# Patient Record
Sex: Male | Born: 1955 | Race: White | Hispanic: No | Marital: Single | Smoking: Current every day smoker
Health system: Southern US, Community
[De-identification: ages and names within clinical notes are randomized; demographics above are authoritative.]

## PROBLEM LIST (undated history)

## (undated) ENCOUNTER — Emergency Department

## (undated) DIAGNOSIS — Z720 Tobacco use: Secondary | ICD-10-CM

## (undated) DIAGNOSIS — M720 Palmar fascial fibromatosis [Dupuytren]: Secondary | ICD-10-CM

## (undated) DIAGNOSIS — K759 Inflammatory liver disease, unspecified: Secondary | ICD-10-CM

## (undated) DIAGNOSIS — J189 Pneumonia, unspecified organism: Secondary | ICD-10-CM

## (undated) HISTORY — PX: FINGER AMPUTATION: SHX636

---

## 2015-10-07 ENCOUNTER — Emergency Department
Admission: EM | Admit: 2015-10-07 | Discharge: 2015-10-07 | Disposition: A | Discharge status: Home / Self Care | Payer: Self-pay | Attending: Emergency Medicine | Admitting: Emergency Medicine

## 2015-10-07 DIAGNOSIS — S8002XA Contusion of left knee, initial encounter: Secondary | ICD-10-CM | POA: Insufficient documentation

## 2015-10-07 DIAGNOSIS — S4992XA Unspecified injury of left shoulder and upper arm, initial encounter: Secondary | ICD-10-CM | POA: Insufficient documentation

## 2015-10-07 DIAGNOSIS — Y998 Other external cause status: Secondary | ICD-10-CM | POA: Insufficient documentation

## 2015-10-07 DIAGNOSIS — S79912A Unspecified injury of left hip, initial encounter: Secondary | ICD-10-CM | POA: Insufficient documentation

## 2015-10-07 DIAGNOSIS — S8001XA Contusion of right knee, initial encounter: Secondary | ICD-10-CM | POA: Insufficient documentation

## 2015-10-07 DIAGNOSIS — Y92410 Unspecified street and highway as the place of occurrence of the external cause: Secondary | ICD-10-CM | POA: Insufficient documentation

## 2015-10-07 DIAGNOSIS — S79911A Unspecified injury of right hip, initial encounter: Secondary | ICD-10-CM | POA: Insufficient documentation

## 2015-10-07 DIAGNOSIS — S50312A Abrasion of left elbow, initial encounter: Secondary | ICD-10-CM | POA: Insufficient documentation

## 2015-10-07 DIAGNOSIS — S4991XA Unspecified injury of right shoulder and upper arm, initial encounter: Secondary | ICD-10-CM | POA: Insufficient documentation

## 2015-10-07 DIAGNOSIS — S59901A Unspecified injury of right elbow, initial encounter: Secondary | ICD-10-CM | POA: Insufficient documentation

## 2015-10-07 DIAGNOSIS — Y9355 Activity, bike riding: Secondary | ICD-10-CM | POA: Insufficient documentation

## 2015-10-07 DIAGNOSIS — T148XXA Other injury of unspecified body region, initial encounter: Secondary | ICD-10-CM

## 2015-10-07 DIAGNOSIS — M62830 Muscle spasm of back: Secondary | ICD-10-CM | POA: Insufficient documentation

## 2015-10-07 MED ORDER — MELOXICAM 15 MG PO TABS: 15.0000 mg | ORAL_TABLET | Freq: Every day | ORAL | Status: DC

## 2015-10-07 MED ORDER — CYCLOBENZAPRINE HCL 10 MG PO TABS: 10.0000 mg | ORAL_TABLET | Freq: Three times a day (TID) | ORAL | Status: DC | PRN
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
## 2016-12-16 ENCOUNTER — Encounter: Payer: Self-pay | Admitting: Emergency Medicine

## 2016-12-16 ENCOUNTER — Emergency Department
Admission: EM | Admit: 2016-12-16 | Discharge: 2016-12-16 | Disposition: A | Discharge status: Home / Self Care | Payer: Managed Care, Other (non HMO) | Attending: Emergency Medicine | Admitting: Emergency Medicine

## 2016-12-16 DIAGNOSIS — M545 Low back pain: Secondary | ICD-10-CM | POA: Diagnosis present

## 2016-12-16 DIAGNOSIS — F17200 Nicotine dependence, unspecified, uncomplicated: Secondary | ICD-10-CM | POA: Diagnosis not present

## 2016-12-16 DIAGNOSIS — M5432 Sciatica, left side: Secondary | ICD-10-CM

## 2016-12-16 DIAGNOSIS — Z79899 Other long term (current) drug therapy: Secondary | ICD-10-CM | POA: Diagnosis not present

## 2016-12-16 DIAGNOSIS — M5442 Lumbago with sciatica, left side: Secondary | ICD-10-CM | POA: Diagnosis not present

## 2016-12-16 DIAGNOSIS — I10 Essential (primary) hypertension: Secondary | ICD-10-CM | POA: Insufficient documentation

## 2016-12-16 MED ORDER — KETOROLAC TROMETHAMINE 30 MG/ML IJ SOLN
30.0000 mg | Freq: Once | INTRAMUSCULAR | Status: AC
  Administered 2016-12-16: 30 mg via INTRAMUSCULAR
  Filled 2016-12-16: qty 1

## 2016-12-16 MED ORDER — OXYCODONE-ACETAMINOPHEN 5-325 MG PO TABS
1.0000 | ORAL_TABLET | Freq: Once | ORAL | Status: AC
  Administered 2016-12-16: 1 via ORAL
  Filled 2016-12-16: qty 1

## 2016-12-16 MED ORDER — OXYCODONE-ACETAMINOPHEN 5-325 MG PO TABS: 1.0000 | ORAL_TABLET | Freq: Four times a day (QID) | ORAL | 0 refills | Status: DC | PRN

## 2016-12-16 MED ORDER — DEXAMETHASONE SODIUM PHOSPHATE 10 MG/ML IJ SOLN
10.0000 mg | Freq: Once | INTRAMUSCULAR | Status: AC
  Administered 2016-12-16: 10 mg via INTRAMUSCULAR
  Filled 2016-12-16: qty 1

## 2016-12-16 NOTE — ED Provider Notes (Signed)
[facility] Emergency Department Provider Note   ____________________________________________    I have reviewed the triage vital signs and the nursing notes.   HISTORY  Chief Complaint Back Pain and Hypertension     HPI Richard Steele is a 60 y.o. male who presents with complaints of back pain with radiation down his left leg. He reports he has a history of sciatica. He began having pain in his left lower back yesterday and the pain travels down his left leg. He denies weakness or heaviness. Denies abdominal pain. No difficulty urinating. No loss of sensation. No IV drug abuse   History reviewed. No pertinent past medical history.  There are no active problems to display for this patient.   History reviewed. No pertinent surgical history.  Prior to Admission medications   Medication Sig Start Date End Date Taking? Authorizing Provider  cyclobenzaprine (FLEXERIL) 10 MG tablet Take 1 tablet (10 mg total) by mouth 3 (three) times daily as needed for muscle spasms. 10/07/15   Delorise RoyalsJonathan D Cuthriell, PA-C  meloxicam (MOBIC) 15 MG tablet Take 1 tablet (15 mg total) by mouth daily. 10/07/15   Delorise RoyalsJonathan D Cuthriell, PA-C     Allergies Codeine  No family history on file.  Social History Social History  Substance Use Topics  . Smoking status: Current Every Day Smoker  . Smokeless tobacco: Never Used  . Alcohol use Yes     Comment: occ    Review of Systems  Constitutional: No fever/chills Eyes: No visual changes.  ENT: No neck pain Cardiovascular: Denies chest pain. Respiratory: Denies shortness of breath. Gastrointestinal: No abdominal pain.  No nausea, no vomiting.   Genitourinary: Negative for Incontinence Musculoskeletal: Positive for right lower back pain Skin: Negative for rash. Neurological: Negative for weakness  10-point ROS otherwise negative.  ____________________________________________   PHYSICAL EXAM:  VITAL SIGNS: ED  Triage Vitals  Enc Vitals Group     BP 12/16/16 0944 (!) 186/96     Pulse Rate 12/16/16 0944 64     Resp 12/16/16 0944 16     Temp 12/16/16 0951 98.2 F (36.8 C)     Temp Source 12/16/16 0951 Oral     SpO2 12/16/16 0940 96 %     Weight 12/16/16 0944 180 lb (81.6 kg)     Height 12/16/16 0944 5\' 6"  (1.676 m)     Head Circumference --      Peak Flow --      Pain Score --      Pain Loc --      Pain Edu? --      Excl. in GC? --     Constitutional: Alert and oriented. . Pleasant and interactive Eyes: Conjunctivae are normal.   Nose: No congestion/rhinnorhea. Mouth/Throat: Mucous membranes are moist.    Cardiovascular: Normal rate, regular rhythm. Grossly normal heart sounds.  Good peripheral circulation. Respiratory: Normal respiratory effort.  No retractions. Lungs CTAB. Gastrointestinal: Soft and nontender. No distention.  No CVA tenderness. Genitourinary: deferred Musculoskeletal: Back: Tenderness to palpation along the left lower lumbar paraspinal area, consistent with muscle spasm. No lower extremity tenderness nor edema.  Warm and well perfused. Normal strength in the lower extremities although pain with elevation of left leg radiates up to his left back Neurologic:  Normal speech and language. No gross focal neurologic deficits are appreciated.  Skin:  Skin is warm, dry and intact. No rash noted. Psychiatric: Mood and affect are normal. Speech and behavior are normal.  ____________________________________________   LABS (all labs ordered are listed, but only abnormal results are displayed)  Labs Reviewed - No data to display ____________________________________________  EKG  None ____________________________________________  RADIOLOGY  None ____________________________________________   PROCEDURES  Procedure(s) performed: No    Critical Care performed: No ____________________________________________   INITIAL IMPRESSION / ASSESSMENT AND PLAN / ED  COURSE  Pertinent labs & imaging results that were available during my care of the patient were reviewed by me and considered in my medical decision making (see chart for details).  Patient present with back pain consistent with sciatica. Does lift heavy boxes for living. He has no lower cavity weakness, urinary incontinence or other concerning symptoms. No vertebral tenderness to palpation. Does have a significant muscle spasm on the left which may be contracting. We will treat with Decadron, Toradol and by mouth Percocet  Clinical Course   Patient's pain improved significant with treatment. He is ambulating in his room without difficulty. We'll discharge with analgesics, we did discuss return precautions. ____________________________________________   FINAL CLINICAL IMPRESSION(S) / ED DIAGNOSES  Final diagnoses:  Sciatica of left side      NEW MEDICATIONS STARTED DURING THIS VISIT:  New Prescriptions   No medications on file     Note:  This document was prepared using Dragon voice recognition software and may include unintentional dictation errors.    Jene Everyobert Makalynn Berwanger, MD 12/16/16 [phone]

## 2016-12-16 NOTE — ED Triage Notes (Signed)
[facility]. Patient states that the pain started 2 days ago but got worse yesterday. Patient states that he has had back trouble in the past but it has been several years. Patient states that the pain radiates down his left leg. Patient reports decreased sensation in the left leg. Patient states that he is able to move his leg but it causes severe pain.   EMS also reported that patient was hypertensive with them with a blood pressure of 186/101. Patient denies history of high blood pressure.

## 2016-12-23 ENCOUNTER — Encounter: Payer: Self-pay | Admitting: Emergency Medicine

## 2016-12-23 ENCOUNTER — Emergency Department
Admission: EM | Admit: 2016-12-23 | Discharge: 2016-12-23 | Disposition: A | Discharge status: Home / Self Care | Payer: Managed Care, Other (non HMO) | Attending: Emergency Medicine | Admitting: Emergency Medicine

## 2016-12-23 ENCOUNTER — Emergency Department: Payer: Managed Care, Other (non HMO)

## 2016-12-23 DIAGNOSIS — F17200 Nicotine dependence, unspecified, uncomplicated: Secondary | ICD-10-CM | POA: Insufficient documentation

## 2016-12-23 DIAGNOSIS — M545 Low back pain: Secondary | ICD-10-CM | POA: Diagnosis present

## 2016-12-23 DIAGNOSIS — M5442 Lumbago with sciatica, left side: Secondary | ICD-10-CM | POA: Diagnosis not present

## 2016-12-23 DIAGNOSIS — M5432 Sciatica, left side: Secondary | ICD-10-CM

## 2016-12-23 MED ORDER — PREDNISONE 10 MG PO TABS: 40.0000 mg | ORAL_TABLET | Freq: Every day | ORAL | 0 refills | Status: AC

## 2016-12-23 MED ORDER — KETOROLAC TROMETHAMINE 60 MG/2ML IM SOLN
30.0000 mg | Freq: Once | INTRAMUSCULAR | Status: AC
  Administered 2016-12-23: 30 mg via INTRAMUSCULAR
  Filled 2016-12-23: qty 2

## 2016-12-23 MED ORDER — MELOXICAM 15 MG PO TABS: 15.0000 mg | ORAL_TABLET | Freq: Every day | ORAL | 0 refills | Status: AC

## 2016-12-23 MED ORDER — OXYCODONE-ACETAMINOPHEN 5-325 MG PO TABS
1.0000 | ORAL_TABLET | Freq: Once | ORAL | Status: AC
  Administered 2016-12-23: 1 via ORAL
  Filled 2016-12-23: qty 1

## 2016-12-23 MED ORDER — OXYCODONE-ACETAMINOPHEN 7.5-325 MG PO TABS: 1.0000 | ORAL_TABLET | ORAL | 0 refills | Status: AC | PRN

## 2016-12-23 NOTE — ED Notes (Signed)
See triage note  Was seen last week for back pain  States pain remains in left lower back and radiates into left leg  Hx of sciatic pain in past which has eased up in about 5-7 days  But this pain has gotten worse

## 2016-12-23 NOTE — ED Triage Notes (Signed)
C/O left back / sciatic nerve pain x 8 days.  Seen through ED last Sunday for same.  States he had been taking Oxycodone for pain, with relief.  Patient is now out of medicaiton.

## 2016-12-23 NOTE — ED Provider Notes (Signed)
[facility] Emergency Department Provider Note  ____________________________________________  Time seen: Approximately 3:43 PM  I have reviewed the triage vital signs and the nursing notes.   HISTORY  Chief Complaint Back Pain    HPI Richard Steele is a 61 y.o. male resents to the emergency department with lower left back pain and shooting pain down the back of left leg. Patient was seen in the emergency room one week ago for same symptoms. Patient states that symptoms have not changed in the last week. Patient states he is very frustrated and angry that he is still in pain. Patient states that it is been difficult to walk secondary to pain. He has been taking oxycodone for the last week, which has helped but patient ran out today. Patient [facility] but was having difficulty making an appointment. Patient had same symptoms 30 years ago which resolved after 5 days and denies back pain since. No trauma. Patient denies night sweats, urinary incontinence, weight loss.   History reviewed. No pertinent past medical history.  There are no active problems to display for this patient.   History reviewed. No pertinent surgical history.  Prior to Admission medications   Medication Sig Start Date End Date Taking? Authorizing Provider  meloxicam (MOBIC) 15 MG tablet Take 1 tablet (15 mg total) by mouth daily. 12/23/16 01/02/17  Enid DerryAshley Damia Bobrowski, PA-C  oxyCODONE-acetaminophen (PERCOCET) 7.5-325 MG tablet Take 1 tablet by mouth every 4 (four) hours as needed for severe pain. 12/23/16 12/23/17  Enid DerryAshley Aspyn Warnke, PA-C  predniSONE (DELTASONE) 10 MG tablet Take 4 tablets (40 mg total) by mouth daily. 12/23/16 12/28/16  Enid DerryAshley Nikko Quast, PA-C    Allergies Codeine  No family history on file.  Social History Social History  Substance Use Topics  . Smoking status: Current Every Day Smoker  . Smokeless tobacco: Never Used  . Alcohol use Yes     Comment: occ      Review of Systems  Constitutional: No fever/chills ENT: No upper respiratory complaints. Cardiovascular: No chest pain. Respiratory: No SOB. Gastrointestinal: No abdominal pain.  No nausea, no vomiting.  Genitourinary: Negative for dysuria. Skin: Negative for rash, abrasions, lacerations, ecchymosis. Neurological: Negative for numbness or tingling   ____________________________________________   PHYSICAL EXAM:  VITAL SIGNS: ED Triage Vitals  Enc Vitals Group     BP 12/23/16 1449 (!) 159/96     Pulse Rate 12/23/16 1449 75     Resp 12/23/16 1449 16     Temp 12/23/16 1449 98.2 F (36.8 C)     Temp Source 12/23/16 1449 Oral     SpO2 12/23/16 1449 99 %     Weight 12/23/16 1448 180 lb (81.6 kg)     Height 12/23/16 1448 5\' 6"  (1.676 m)     Head Circumference --      Peak Flow --      Pain Score 12/23/16 1448 8     Pain Loc --      Pain Edu? --      Excl. in GC? --      Constitutional: Alert and oriented. Well appearing and in no acute distress. Eyes: Conjunctivae are normal. PERRL. EOMI. Head: Atraumatic. ENT:      Ears:      Nose: No congestion/rhinnorhea.      Mouth/Throat: Mucous membranes are moist.  Neck: No stridor.  Cardiovascular: Normal rate, regular rhythm. Normal S1 and S2.  Good peripheral circulation. Respiratory: Normal respiratory effort without tachypnea or retractions.  Lungs CTAB. Good air entry to the bases with no decreased or absent breath sounds. Gastrointestinal: Bowel sounds 4 quadrants. Soft and nontender to palpation. No guarding or rigidity. No palpable masses. No distention. No CVA tenderness. Musculoskeletal: No gross deformities appreciated. Positive straight leg raise and Faber 4. Tenderness to palpation over left SI joint and over left lower lumbar paraspinal area. No lower extremity tenderness. No edema. 5/5 strength in lower extremities. Neurologic:  Normal speech and language. No gross focal neurologic deficits are appreciated.   Skin:  Skin is warm, dry and intact. No rash noted.   ____________________________________________   LABS (all labs ordered are listed, but only abnormal results are displayed)  Labs Reviewed - No data to display ____________________________________________  EKG   ____________________________________________  RADIOLOGY Lexine Baton, personally viewed and evaluated these images (plain radiographs) as part of my medical decision making, as well as reviewing the written report by the radiologist.  Dg Lumbar Spine 2-3 Views  Result Date: 12/23/2016 CLINICAL DATA:  Lumbago with lower extremity radicular symptoms EXAM: LUMBAR SPINE - 2-3 VIEW COMPARISON:  None. FINDINGS: Frontal, lateral, and spot lumbosacral lateral images were obtained. There are 5 non-rib-bearing lumbar type vertebral bodies. There is dextroscoliosis with a rotatory component. There is no demonstrable fracture or spondylolisthesis. There is moderate disc space narrowing at all visualized lower thoracic levels as well as at L3-4, L4-5, and L5-S1. No erosive change. There is aortoiliac atherosclerotic calcification. IMPRESSION: Scoliosis. Multifocal arthropathy. No fracture or spondylolisthesis. There is aortoiliac atherosclerosis. Electronically Signed   By: Bretta Bang III M.D.   On: 12/23/2016 16:12    ____________________________________________    PROCEDURES  Procedure(s) performed:    Procedures    Medications  oxyCODONE-acetaminophen (PERCOCET/ROXICET) 5-325 MG per tablet 1 tablet (1 tablet Oral Given 12/23/16 1603)  ketorolac (TORADOL) injection 30 mg (30 mg Intramuscular Given 12/23/16 1603)     ____________________________________________   INITIAL IMPRESSION / ASSESSMENT AND PLAN / ED COURSE  Pertinent labs & imaging results that were available during my care of the patient were reviewed by me and considered in my medical decision making (see chart for details).  Review of the [place] CSRS  was performed in accordance of the [facility] prior to dispensing any controlled drugs.  Clinical Course     Patient presents with back pain for 8 days. No acute abnormality seen on lumbar x-ray. Positive straight leg raise and Faber 4. Tenderness to palpation over left SI joint. Symptoms improved with Toradol and Percocet. Patient was referred to orthopedics and pain clinic. No evidence of cauda hematoma, vascular catastrophe, spinal abscess/hematoma, or referred intra-abdominal pain. All patients questions questions were answered. Patient will be discharged home with prescriptions for prednisone, meloxicam, percocet. Patient is given ED precautions to return to the ED for any worsening or new symptoms.     ____________________________________________  FINAL CLINICAL IMPRESSION(S) / ED DIAGNOSES  Final diagnoses:  Sciatica of left side      NEW MEDICATIONS STARTED DURING THIS VISIT:  New Prescriptions   MELOXICAM (MOBIC) 15 MG TABLET    Take 1 tablet (15 mg total) by mouth daily.   OXYCODONE-ACETAMINOPHEN (PERCOCET) 7.5-325 MG TABLET    Take 1 tablet by mouth every 4 (four) hours as needed for severe pain.   PREDNISONE (DELTASONE) 10 MG TABLET    Take 4 tablets (40 mg total) by mouth daily.        This chart was dictated using voice recognition software/Dragon. Despite best efforts to proofread,  errors can occur which can change the meaning. Any change was purely unintentional.     Enid Derry, PA-C 12/23/16 1647    Emily Filbert, MD 12/23/16 1806

## 2018-08-08 ENCOUNTER — Emergency Department
Admission: EM | Admit: 2018-08-08 | Discharge: 2018-08-08 | Disposition: A | Discharge status: Home / Self Care | Payer: Managed Care, Other (non HMO) | Attending: Emergency Medicine | Admitting: Emergency Medicine

## 2018-08-08 ENCOUNTER — Other Ambulatory Visit: Payer: Self-pay

## 2018-08-08 DIAGNOSIS — W57XXXA Bitten or stung by nonvenomous insect and other nonvenomous arthropods, initial encounter: Secondary | ICD-10-CM | POA: Insufficient documentation

## 2018-08-08 DIAGNOSIS — L299 Pruritus, unspecified: Secondary | ICD-10-CM | POA: Insufficient documentation

## 2018-08-08 DIAGNOSIS — F17200 Nicotine dependence, unspecified, uncomplicated: Secondary | ICD-10-CM | POA: Insufficient documentation

## 2018-08-08 DIAGNOSIS — S40862A Insect bite (nonvenomous) of left upper arm, initial encounter: Secondary | ICD-10-CM

## 2018-08-08 DIAGNOSIS — S80861A Insect bite (nonvenomous), right lower leg, initial encounter: Secondary | ICD-10-CM

## 2018-08-08 MED ORDER — HYDROCORTISONE 0.5 % EX CREA: 1.0000 "application " | TOPICAL_CREAM | Freq: Two times a day (BID) | CUTANEOUS | 0 refills | Status: DC

## 2018-08-08 MED ORDER — HYDROXYZINE HCL 50 MG PO TABS: 50.0000 mg | ORAL_TABLET | Freq: Three times a day (TID) | ORAL | 0 refills | Status: DC | PRN

## 2018-08-08 NOTE — ED Notes (Addendum)
See triage note  Unsure what stung him  But he noticed some areas to both areas and right leg  Areas appear red and swollen

## 2018-08-08 NOTE — ED Triage Notes (Signed)
Pt states yesterday while at the [employer] on the computer he started to itch, pt noticed places on his left arm,rt arm, and rt leg. Areas are swollen and red, pt reports that he has been using rubbing alcohol to help with the itching

## 2018-08-08 NOTE — ED Provider Notes (Signed)
[facility] Emergency Department Provider Note   ____________________________________________   First MD Initiated Contact with Patient 08/08/18 1433     (approximate)  I have reviewed the triage vital signs and the nursing notes.   HISTORY  Chief Complaint Insect Bite    HPI Richard Steele is a 62 y.o. male patient complain of itching secondary to insect bites on his left arm and right leg.  Patient  believes sect bites occurred [place] [employer].  Patient states he he is using alcohol to relieve the itching.  Patient had areas of increasing redness.  No past medical history on file.  There are no active problems to display for this patient.   No past surgical history on file.  Prior to Admission medications   Medication Sig Start Date End Date Taking? Authorizing Provider  hydrocortisone cream 0.5 % Apply 1 application topically 2 (two) times daily. 08/08/18   Joni ReiningSmith, Shimon Trowbridge K, PA-C  hydrOXYzine (ATARAX/VISTARIL) 50 MG tablet Take 1 tablet (50 mg total) by mouth 3 (three) times daily as needed for itching. 08/08/18   Joni ReiningSmith, Annelle Behrendt K, PA-C    Allergies Codeine  No family history on file.  Social History Social History   Tobacco Use  . Smoking status: Current Every Day Smoker  . Smokeless tobacco: Never Used  Substance Use Topics  . Alcohol use: Yes    Comment: occ  . Drug use: Not on file    Review of Systems Constitutional: No fever/chills Eyes: No visual changes. ENT: No sore throat. Cardiovascular: Denies chest pain. Respiratory: Denies shortness of breath. Gastrointestinal: No abdominal pain.  No nausea, no vomiting.  No diarrhea.  No constipation. Genitourinary: Negative for dysuria. Musculoskeletal: Negative for back pain. Skin: Positive for rash. Neurological: Negative for headaches, focal weakness or numbness.   ____________________________________________   PHYSICAL EXAM:  VITAL SIGNS: ED Triage Vitals  Enc Vitals  Group     BP 08/08/18 1430 (!) 160/89     Pulse Rate 08/08/18 1430 96     Resp 08/08/18 1430 18     Temp 08/08/18 1430 98.3 F (36.8 C)     Temp Source 08/08/18 1430 Oral     SpO2 08/08/18 1430 99 %     Weight 08/08/18 1431 175 lb (79.4 kg)     Height 08/08/18 1431 5\' 7"  (1.702 m)     Head Circumference --      Peak Flow --      Pain Score 08/08/18 1431 0     Pain Loc --      Pain Edu? --      Excl. in GC? --    Constitutional: Alert and oriented. Well appearing and in no acute distress. Cardiovascular: Normal rate, regular rhythm. Grossly normal heart sounds.  Good peripheral circulation.  Elevated blood pressure Respiratory: Normal respiratory effort.  No retractions. Lungs CTAB. Musculoskeletal: No lower extremity tenderness nor edema.  No joint effusions. Neurologic:  Normal speech and language. No gross focal neurologic deficits are appreciated. No gait instability. Skin:  Skin is warm, dry and intact.  Papular lesions on erythematous base with signs of excoriation.   Psychiatric: Mood and affect are normal. Speech and behavior are normal.  ____________________________________________   LABS (all labs ordered are listed, but only abnormal results are displayed)  Labs Reviewed - No data to display ____________________________________________  EKG   ____________________________________________  RADIOLOGY  ED MD interpretation:    Official radiology report(s): No results found.  ____________________________________________  PROCEDURES  Procedure(s) performed: None  Procedures  Critical Care performed: No  ____________________________________________   INITIAL IMPRESSION / ASSESSMENT AND PLAN / ED COURSE  As part of my medical decision making, I reviewed the following data within the electronic MEDICAL RECORD NUMBER   Localized reaction to insect bites.  Patient given discharge care instruction advised take medication as directed.  Patient advised [facility]       ____________________________________________   FINAL CLINICAL IMPRESSION(S) / ED DIAGNOSES  Final diagnoses:  Insect bite of left upper extremity, initial encounter  Insect bite of right lower extremity, initial encounter     ED Discharge Orders         Ordered    hydrOXYzine (ATARAX/VISTARIL) 50 MG tablet  3 times daily PRN     08/08/18 1521    hydrocortisone cream 0.5 %  2 times daily     08/08/18 1521           Note:  This document was prepared using Dragon voice recognition software and may include unintentional dictation errors.    Joni Reining, PA-C 08/08/18 1546    Jene Every, MD 08/08/18 [phone]

## 2018-11-06 ENCOUNTER — Emergency Department
Admission: EM | Admit: 2018-11-06 | Discharge: 2018-11-08 | Disposition: A | Discharge status: Other Acute Inpatient Hospital | Payer: Self-pay | Attending: Emergency Medicine | Admitting: Emergency Medicine

## 2018-11-06 ENCOUNTER — Other Ambulatory Visit: Payer: Self-pay

## 2018-11-06 ENCOUNTER — Emergency Department: Payer: Self-pay

## 2018-11-06 DIAGNOSIS — E861 Hypovolemia: Secondary | ICD-10-CM

## 2018-11-06 DIAGNOSIS — S0990XA Unspecified injury of head, initial encounter: Secondary | ICD-10-CM | POA: Insufficient documentation

## 2018-11-06 DIAGNOSIS — F17210 Nicotine dependence, cigarettes, uncomplicated: Secondary | ICD-10-CM | POA: Insufficient documentation

## 2018-11-06 DIAGNOSIS — Y999 Unspecified external cause status: Secondary | ICD-10-CM | POA: Insufficient documentation

## 2018-11-06 DIAGNOSIS — Y92488 Other paved roadways as the place of occurrence of the external cause: Secondary | ICD-10-CM | POA: Insufficient documentation

## 2018-11-06 DIAGNOSIS — S3991XA Unspecified injury of abdomen, initial encounter: Secondary | ICD-10-CM | POA: Insufficient documentation

## 2018-11-06 DIAGNOSIS — M5489 Other dorsalgia: Secondary | ICD-10-CM | POA: Insufficient documentation

## 2018-11-06 DIAGNOSIS — K661 Hemoperitoneum: Secondary | ICD-10-CM | POA: Insufficient documentation

## 2018-11-06 DIAGNOSIS — S36039A Unspecified laceration of spleen, initial encounter: Secondary | ICD-10-CM | POA: Insufficient documentation

## 2018-11-06 DIAGNOSIS — R571 Hypovolemic shock: Secondary | ICD-10-CM | POA: Insufficient documentation

## 2018-11-06 DIAGNOSIS — S36899A Unspecified injury of other intra-abdominal organs, initial encounter: Secondary | ICD-10-CM

## 2018-11-06 DIAGNOSIS — K559 Vascular disorder of intestine, unspecified: Secondary | ICD-10-CM

## 2018-11-06 DIAGNOSIS — M549 Dorsalgia, unspecified: Secondary | ICD-10-CM

## 2018-11-06 DIAGNOSIS — Y9389 Activity, other specified: Secondary | ICD-10-CM | POA: Insufficient documentation

## 2018-11-06 DIAGNOSIS — Z79899 Other long term (current) drug therapy: Secondary | ICD-10-CM | POA: Insufficient documentation

## 2018-11-06 LAB — POCT I-STAT, CHEM 8
BUN: 16 mg/dL (ref 8–23)
CALCIUM ION: 1.19 mmol/L (ref 1.15–1.40)
CHLORIDE: 106 mmol/L (ref 98–111)
Creatinine, Ser: 1 mg/dL (ref 0.61–1.24)
GLUCOSE: 132 mg/dL — AB (ref 70–99)
HCT: 41 % (ref 39.0–52.0)
Hemoglobin: 13.9 g/dL (ref 13.0–17.0)
Potassium: 3.9 mmol/L (ref 3.5–5.1)
Sodium: 138 mmol/L (ref 135–145)
TCO2: 23 mmol/L (ref 22–32)

## 2018-11-06 LAB — TYPE AND SCREEN
ABO/RH(D): O NEG
ANTIBODY SCREEN: NEGATIVE

## 2018-11-06 LAB — CBC WITH DIFFERENTIAL/PLATELET
Abs Immature Granulocytes: 0.11 10*3/uL — ABNORMAL HIGH (ref 0.00–0.07)
BASOS ABS: 0.1 10*3/uL (ref 0.0–0.1)
BASOS PCT: 1 %
EOS ABS: 0.1 10*3/uL (ref 0.0–0.5)
EOS PCT: 1 %
HEMATOCRIT: 41.2 % (ref 39.0–52.0)
Hemoglobin: 13.7 g/dL (ref 13.0–17.0)
IMMATURE GRANULOCYTES: 1 %
Lymphocytes Relative: 15 %
Lymphs Abs: 2.4 10*3/uL (ref 0.7–4.0)
MCH: 30.6 pg (ref 26.0–34.0)
MCHC: 33.3 g/dL (ref 30.0–36.0)
MCV: 92 fL (ref 80.0–100.0)
Monocytes Absolute: 1.7 10*3/uL — ABNORMAL HIGH (ref 0.1–1.0)
Monocytes Relative: 10 %
NEUTROS PCT: 72 %
NRBC: 0 % (ref 0.0–0.2)
Neutro Abs: 11.6 10*3/uL — ABNORMAL HIGH (ref 1.7–7.7)
PLATELETS: 270 10*3/uL (ref 150–400)
RBC: 4.48 MIL/uL (ref 4.22–5.81)
RDW: 13 % (ref 11.5–15.5)
WBC: 16 10*3/uL — AB (ref 4.0–10.5)

## 2018-11-06 LAB — PROTIME-INR
INR: 0.88
Prothrombin Time: 11.9 seconds (ref 11.4–15.2)

## 2018-11-06 LAB — LIPASE, BLOOD: LIPASE: 30 U/L (ref 11–51)

## 2018-11-06 LAB — TROPONIN I

## 2018-11-06 LAB — ETHANOL

## 2018-11-06 MED ORDER — SODIUM CHLORIDE 0.9 % IV BOLUS
1000.0000 mL | Freq: Once | INTRAVENOUS | Status: AC
  Administered 2018-11-06: 1000 mL via INTRAVENOUS

## 2018-11-06 MED ORDER — HYDROMORPHONE HCL 1 MG/ML IJ SOLN
INTRAMUSCULAR | Status: AC
  Filled 2018-11-06: qty 1

## 2018-11-06 MED ORDER — HYDROMORPHONE HCL 1 MG/ML IJ SOLN: 1.0000 mg | Freq: Once | INTRAMUSCULAR | Status: DC

## 2018-11-06 MED ORDER — ONDANSETRON HCL 4 MG/2ML IJ SOLN
4.0000 mg | Freq: Once | INTRAMUSCULAR | Status: AC
  Administered 2018-11-06: 4 mg via INTRAVENOUS
  Filled 2018-11-06: qty 2

## 2018-11-06 MED ORDER — HYDROMORPHONE HCL 1 MG/ML IJ SOLN
1.0000 mg | INTRAMUSCULAR | Status: AC
  Administered 2018-11-06: 1 mg via INTRAVENOUS
  Filled 2018-11-06: qty 1

## 2018-11-06 MED ORDER — MORPHINE SULFATE (PF) 4 MG/ML IV SOLN
4.0000 mg | Freq: Once | INTRAVENOUS | Status: AC
  Administered 2018-11-06: 4 mg via INTRAVENOUS
  Filled 2018-11-06: qty 1

## 2018-11-06 MED ORDER — IOPAMIDOL (ISOVUE-300) INJECTION 61%
100.0000 mL | Freq: Once | INTRAVENOUS | Status: AC | PRN
  Administered 2018-11-06: 100 mL via INTRAVENOUS

## 2018-11-06 MED ORDER — SODIUM CHLORIDE 0.9 % IV BOLUS: 1000.0000 mL | Freq: Once | INTRAVENOUS | Status: DC
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
## 2018-11-06 NOTE — ED Notes (Addendum)
Debbora Duserry Adkins Landlord/friend [phone] pt gives permission to give him transfer and status information

## 2018-11-06 NOTE — ED Notes (Signed)
Dilaudid 1mg  given via verbal order Alicia Amel Forbach MD

## 2018-11-06 NOTE — ED Notes (Signed)
[facility]

## 2018-11-06 NOTE — ED Notes (Signed)
Pt sitting on bedside chair. When stood up, heart rate dropped and pt diaphoretic and confused. States he is going to pass out and it is too hot. Help to room. Assisted to bed. 2nd IV started. EDP at bedside

## 2018-11-06 NOTE — ED Notes (Signed)
Attempt to call Debbora Duserry Adkins no answer and voice mailbox full

## 2018-11-06 NOTE — ED Notes (Signed)
EM[facility]ALA reviewed , downtime paper transfer signed
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
## 2020-04-28 ENCOUNTER — Ambulatory Visit: Payer: Self-pay

## 2020-05-04 ENCOUNTER — Encounter: Payer: Self-pay | Admitting: Gerontology

## 2020-05-04 ENCOUNTER — Ambulatory Visit: Payer: Self-pay | Admitting: Gerontology

## 2020-05-04 ENCOUNTER — Other Ambulatory Visit: Payer: Self-pay

## 2020-05-04 VITALS — BP 136/87 | HR 99 | Temp 97.2°F | Ht 66.0 in | Wt 175.0 lb

## 2020-05-04 DIAGNOSIS — Z7689 Persons encountering health services in other specified circumstances: Secondary | ICD-10-CM | POA: Insufficient documentation

## 2020-05-04 DIAGNOSIS — R21 Rash and other nonspecific skin eruption: Secondary | ICD-10-CM | POA: Insufficient documentation

## 2020-05-04 DIAGNOSIS — F17200 Nicotine dependence, unspecified, uncomplicated: Secondary | ICD-10-CM | POA: Insufficient documentation

## 2020-05-04 DIAGNOSIS — K047 Periapical abscess without sinus: Secondary | ICD-10-CM

## 2020-05-04 MED ORDER — TRIAMCINOLONE ACETONIDE 0.1 % EX CREA: 1.0000 "application " | TOPICAL_CREAM | Freq: Two times a day (BID) | CUTANEOUS | 0 refills | Status: DC

## 2020-05-04 MED ORDER — CHLORHEXIDINE GLUCONATE 0.12 % MT SOLN: 15.0000 mL | Freq: Two times a day (BID) | OROMUCOSAL | 0 refills | Status: DC

## 2020-05-04 MED ORDER — AMOXICILLIN-POT CLAVULANATE 875-125 MG PO TABS: 1.0000 | ORAL_TABLET | Freq: Two times a day (BID) | ORAL | 0 refills | Status: DC

## 2020-05-04 NOTE — Progress Notes (Signed)
Patient ID: Richard Steele, male   DOB: February 03, 1956, 64 y.o.   MRN: [id number]  Chief Complaint  Patient presents with  . Establish Care  . abcessed tooth    left side, face swollen, ongoing for 1 day    HPI Richard Steele is a 64 y.o. male who presents to establish care and evaluation of his chronic condition. He reports that he has swelling and erythema to left upper teeth that started yesterday. He states that it's not painful when chewing his food and requests dental referral. He states that he smokes 1 pack daily and denies the desire to quit and declines Low dose CT scan. He states that he has a pruritic rash to his upper back that started 3 months ago. He states that it started after he slept on a new bed sheet. He denies fever and chills. Overall, he states that he's doing well and offers no further complaint.  No past medical history on file.  No past surgical history on file.  No family history on file.  Social History Social History   Tobacco Use  . Smoking status: Current Every Day Smoker  . Smokeless tobacco: Never Used  Substance Use Topics  . Alcohol use: Yes    Alcohol/week: 4.0 standard drinks    Types: 4 Cans of beer per week  . Drug use: Not Currently    Allergies  Allergen Reactions  . Codeine Rash    No current outpatient medications on file.   No current facility-administered medications for this visit.    Review of Systems Review of Systems  Constitutional: Negative.   HENT: Positive for dental problem.   Eyes: Negative.   Respiratory: Negative.   Cardiovascular: Negative.   Gastrointestinal: Negative.   Endocrine: Negative.   Genitourinary: Negative.   Musculoskeletal: Negative.   Skin: Positive for rash (upper back).  Neurological: Negative.   Hematological: Negative.   Psychiatric/Behavioral: Negative.     Blood pressure 136/87, pulse 99, temperature (!) 97.2 F (36.2 C), height 5' 6"  (1.676 m), weight 175 lb (79.4 kg), SpO2 98  %.  Physical Exam Physical Exam Constitutional:      Appearance: Normal appearance.  HENT:     Head: Normocephalic and atraumatic.     Nose:     Comments: Deferred per Covid protocol    Mouth/Throat:     Lips: Pink.     Mouth: Mucous membranes are moist.     Dentition: Abnormal dentition. Dental abscesses (left upper mandibule) present.  Eyes:     Extraocular Movements: Extraocular movements intact.     Pupils: Pupils are equal, round, and reactive to light.  Cardiovascular:     Rate and Rhythm: Normal rate and regular rhythm.  Pulmonary:     Effort: Pulmonary effort is normal.     Breath sounds: Normal breath sounds.  Abdominal:     General: Bowel sounds are normal.     Palpations: Abdomen is soft.  Genitourinary:    Comments: Deferred per patient. Musculoskeletal:     Cervical back: Normal range of motion.  Skin:    Findings: Rash (erythymatous rash to upper back) present.  Neurological:     General: No focal deficit present.     Mental Status: He is alert and oriented to person, place, and time. Mental status is at baseline.  Psychiatric:        Mood and Affect: Mood normal.        Behavior: Behavior normal.  Thought Content: Thought content normal.        Judgment: Judgment normal.     Data Reviewed Past medical history was reviewed.    Assessment and Plan  1. Encounter to establish care -Routine labs will be checked - HgB A1c; Future - CBC w/Diff; Future - Comp Met (CMET); Future - Lipid panel; Future - Urinalysis; Future  2. Tooth abscess - He will continue on Augmentin advised on medication side effects and to notify clinic. - amoxicillin-clavulanate (AUGMENTIN) 875-125 MG tablet; Take 1 tablet by mouth 2 (two) times daily.  Dispense: 10 tablet; Refill: 0 - chlorhexidine (PERIDEX) 0.12 % solution; Use as directed 15 mLs in the mouth or throat 2 (two) times daily.  Dispense: 120 mL; Refill: 0 - Dental referral  3. Rash of back - Ddx Contact  dermatitis, will continue on Triamcinolone, advised on medication side effects and to notify clinic. - triamcinolone cream (KENALOG) 0.1 %; Apply 1 application topically 2 (two) times daily.  Dispense: 30 g; Refill: 0  4. Smoking - He was strongly advised on smoking cessation and was provided with [place] Quit line information.   Follow up: 06/01/2020 or if symptom worsens or fails to improve.  Chioma E Iloabachie 05/04/2020, 1:43 PM

## 2020-05-04 NOTE — Patient Instructions (Signed)

## 2020-05-11 ENCOUNTER — Telehealth: Payer: Self-pay | Admitting: General Practice

## 2020-05-11 NOTE — Telephone Encounter (Signed)
Called pt to get him scheduled for follow up appts. I was able to leave a message and a call back number but Richard Steele will need to reach out for scheduling

## 2020-07-10 ENCOUNTER — Emergency Department
Admission: EM | Admit: 2020-07-10 | Discharge: 2020-07-10 | Disposition: A | Discharge status: Home / Self Care | Payer: Self-pay | Attending: Emergency Medicine | Admitting: Emergency Medicine

## 2020-07-10 ENCOUNTER — Encounter: Payer: Self-pay | Admitting: Physician Assistant

## 2020-07-10 ENCOUNTER — Other Ambulatory Visit: Payer: Self-pay

## 2020-07-10 DIAGNOSIS — Y929 Unspecified place or not applicable: Secondary | ICD-10-CM | POA: Insufficient documentation

## 2020-07-10 DIAGNOSIS — Y999 Unspecified external cause status: Secondary | ICD-10-CM | POA: Insufficient documentation

## 2020-07-10 DIAGNOSIS — Y939 Activity, unspecified: Secondary | ICD-10-CM | POA: Insufficient documentation

## 2020-07-10 DIAGNOSIS — F17200 Nicotine dependence, unspecified, uncomplicated: Secondary | ICD-10-CM | POA: Insufficient documentation

## 2020-07-10 DIAGNOSIS — X58XXXA Exposure to other specified factors, initial encounter: Secondary | ICD-10-CM | POA: Insufficient documentation

## 2020-07-10 DIAGNOSIS — T63441A Toxic effect of venom of bees, accidental (unintentional), initial encounter: Secondary | ICD-10-CM | POA: Insufficient documentation

## 2020-07-10 MED ORDER — DEXAMETHASONE SODIUM PHOSPHATE 10 MG/ML IJ SOLN
10.0000 mg | Freq: Once | INTRAMUSCULAR | Status: AC
  Administered 2020-07-10: 10 mg via INTRAMUSCULAR
  Filled 2020-07-10: qty 1
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
## 2020-07-10 NOTE — Discharge Instructions (Addendum)
Your exam is normal at this time.  Have a local reaction to a bee sting.  No sign of anaphylaxis or difficulty breathing.  Take over-the-counter Benadryl as needed for itch relief.  Follow-up with your provider or [facility] clinic for ongoing symptoms.
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
## 2021-03-02 ENCOUNTER — Encounter: Payer: Self-pay | Admitting: Emergency Medicine

## 2021-03-02 ENCOUNTER — Emergency Department
Admission: EM | Admit: 2021-03-02 | Discharge: 2021-03-02 | Disposition: A | Discharge status: Home / Self Care | Payer: Medicare Other | Attending: Emergency Medicine | Admitting: Emergency Medicine

## 2021-03-02 ENCOUNTER — Other Ambulatory Visit: Payer: Self-pay

## 2021-03-02 DIAGNOSIS — Z91040 Latex allergy status: Secondary | ICD-10-CM | POA: Diagnosis not present

## 2021-03-02 DIAGNOSIS — F17200 Nicotine dependence, unspecified, uncomplicated: Secondary | ICD-10-CM | POA: Diagnosis not present

## 2021-03-02 DIAGNOSIS — R21 Rash and other nonspecific skin eruption: Secondary | ICD-10-CM | POA: Diagnosis present

## 2021-03-02 MED ORDER — MUPIROCIN CALCIUM 2 % EX CREA: TOPICAL_CREAM | CUTANEOUS | 0 refills | Status: DC

## 2021-03-02 MED ORDER — DOXYCYCLINE HYCLATE 50 MG PO TABS: 100.0000 mg | ORAL_TABLET | Freq: Two times a day (BID) | ORAL | 0 refills | Status: AC

## 2021-03-02 MED ORDER — DOXYCYCLINE HYCLATE 100 MG PO TABS
100.0000 mg | ORAL_TABLET | Freq: Once | ORAL | Status: AC
  Administered 2021-03-02: 100 mg via ORAL
  Filled 2021-03-02: qty 1

## 2021-03-02 NOTE — ED Notes (Signed)
See triage note  Presents with rash to left upper arm  States he thinks he may be allergic to latex b/c he used a latex bandage to arm about 1 month ago

## 2021-03-02 NOTE — ED Provider Notes (Signed)
[facility] Emergency Department Provider Note  ____________________________________________   Event Date/Time   First MD Initiated Contact with Patient 03/02/21 1502     (approximate)  I have reviewed the triage vital signs and the nursing notes.   HISTORY  Chief Complaint Rash  HPI Richard Steele is a 65 y.o. male who presents to the emergency department for evaluation of rash on the left arm.  Patient states that approximately 1 month ago, he applied a latex bandage to the lateral aspect of his distal upper arm on the left side, and 24 hours later, he had developed a reaction with blistering.  He believes that this was due to being allergic to the latex, but does not note any history of this.  Since that time, he has been cleaning the wound with a combination of hydrogen peroxide and alcohol, but states that the location of the previous vesicles have still not healed.  He states the area turned red 2 to 3 days ago, but had not previously been like that.  He denies fevers, decreased range of motion of the extremity, or radiation of symptoms.  Denies any other location of rash, or any other related symptoms.  Denies any new medications in the last month.         History reviewed. No pertinent past medical history.  Patient Active Problem List   Diagnosis Date Noted  . Encounter to establish care 05/04/2020  . Tooth abscess 05/04/2020  . Rash of back 05/04/2020  . Smoking 05/04/2020    History reviewed. No pertinent surgical history.  Prior to Admission medications   Medication Sig Start Date End Date Taking? Authorizing Provider  Doxycycline Hyclate 50 MG TABS Take 100 mg by mouth in the morning and at bedtime for 7 days. 03/02/21 03/09/21 Yes Lucy Chris, PA  mupirocin cream Idelle Jo) 2 % Apply to affected area 3 times daily 03/02/21 03/02/22 Yes Lucy Chris, PA    Allergies Codeine and Latex  History reviewed. No pertinent family  history.  Social History Social History   Tobacco Use  . Smoking status: Current Every Day Smoker  . Smokeless tobacco: Never Used  Vaping Use  . Vaping Use: Never used  Substance Use Topics  . Alcohol use: Yes    Alcohol/week: 4.0 standard drinks    Types: 4 Cans of beer per week  . Drug use: Not Currently    Review of Systems Constitutional: No fever/chills Eyes: No visual changes. ENT: No sore throat. Cardiovascular: Denies chest pain. Respiratory: Denies shortness of breath. Gastrointestinal: No abdominal pain.  No nausea, no vomiting.  No diarrhea.  No constipation. Genitourinary: Negative for dysuria. Musculoskeletal: Negative for back pain. Skin: + rash. Neurological: Negative for headaches, focal weakness or numbness.   ____________________________________________   PHYSICAL EXAM:  VITAL SIGNS: ED Triage Vitals  Enc Vitals Group     BP 03/02/21 1426 (!) 164/92     Pulse Rate 03/02/21 1426 67     Resp 03/02/21 1426 17     Temp 03/02/21 1426 98.2 F (36.8 C)     Temp Source 03/02/21 1426 Oral     SpO2 03/02/21 1426 98 %     Weight 03/02/21 1412 170 lb (77.1 kg)     Height 03/02/21 1412 5\' 6"  (1.676 m)     Head Circumference --      Peak Flow --      Pain Score 03/02/21 1412 0     Pain Loc --  Pain Edu? --      Excl. in GC? --    Constitutional: Alert and oriented. Well appearing and in no acute distress. Eyes: Conjunctivae are normal. PERRL. EOMI. Head: Atraumatic. Nose: No congestion/rhinnorhea. Mouth/Throat: Mucous membranes are moist.  Oropharynx non-erythematous. Neck: No stridor.   Cardiovascular: Normal rate, regular rhythm. Grossly normal heart sounds.  Good peripheral circulation. Respiratory: Normal respiratory effort.  No retractions. Lungs CTAB. Gastrointestinal: Soft and nontender. No distention. No abdominal bruits. No CVA tenderness. Musculoskeletal: Full range of motion of the left upper extremity at the shoulder, elbow and wrist  with difficulty. Neurologic:  Normal speech and language. No gross focal neurologic deficits are appreciated. No gait instability. Skin: There is a 2 inch x 2 inch rash on the lateral aspect of the left upper arm.  The base of it appears to be a dry with a faint erythema as well as several areas of closing wounds, presumably at site of previous vesicles.  There is scattered scabbing present.  No new vesicles, negative Nikolsky sign, no induration or gross erythema. Psychiatric: Mood and affect are normal. Speech and behavior are normal.  ____________________________________________   INITIAL IMPRESSION / ASSESSMENT AND PLAN / ED COURSE  As part of my medical decision making, I reviewed the following data within the electronic MEDICAL RECORD NUMBER Nursing notes reviewed and incorporated and Notes from prior ED visits        Patient is a 65 year old male who presents to the emergency department for evaluation of rash that has been present for the last month, see HPI for further details.  Patient denies any fever, spread of the rash or any other systemic symptoms.  On physical exam, there is a rash noted to the lateral aspect of the left upper arm, consistent with overdrying from the chemicals patient is applying.  There is a faint erythema present at the base, which patient reports is new.  Given this, will cover the patient for possible early infection with doxycycline and mupirocin ointment.  Return precautions were discussed, and patient is amenable with plan and stable this time for outpatient follow-up.      ____________________________________________   FINAL CLINICAL IMPRESSION(S) / ED DIAGNOSES  Final diagnoses:  Rash     ED Discharge Orders         Ordered    mupirocin cream (BACTROBAN) 2 %        03/02/21 1529    Doxycycline Hyclate 50 MG TABS  2 times daily        03/02/21 1529          *Please note:  Richard Steele was evaluated in Emergency Department on 03/02/2021 for  the symptoms described in the history of present illness. He was evaluated in the context of the global COVID-19 pandemic, which necessitated consideration that the patient might be at risk for infection with the SARS-CoV-2 virus that causes COVID-19. Institutional protocols and algorithms that pertain to the evaluation of patients at risk for COVID-19 are in a state of rapid change based on information released by regulatory bodies including the CDC and federal and state organizations. These policies and algorithms were followed during the patient's care in the ED.  Some ED evaluations and interventions may be delayed as a result of limited staffing during and the pandemic.*   Note:  This document was prepared using Dragon voice recognition software and may include unintentional dictation errors.   Lucy Chris, PA 03/02/21 1638    Shaune Pollack, MD  03/03/21 1519  

## 2021-03-02 NOTE — ED Triage Notes (Signed)
First RN Note: pt to ED via POV with c/o rash to L upper arm. Pt states had a latex bandage placed approx 1 month ago, pt states blisters popped up a month ago, has been putting peroxide and alcohol on the wound. Pt with noted scabs to arm where bandage was. Pt states has not healed, scabbing noted at this time, no redness noted at this time. Pt A&O x4, NAD noted at this time.

## 2021-03-02 NOTE — Discharge Instructions (Signed)
Take antibiotics as prescribed. Follow up with primary care or return to ER for any worsening.

## 2021-04-18 ENCOUNTER — Other Ambulatory Visit: Payer: Self-pay

## 2021-04-18 ENCOUNTER — Emergency Department
Admission: EM | Admit: 2021-04-18 | Discharge: 2021-04-18 | Disposition: A | Discharge status: Home / Self Care | Payer: Medicare Other | Attending: Emergency Medicine | Admitting: Emergency Medicine

## 2021-04-18 DIAGNOSIS — Z91040 Latex allergy status: Secondary | ICD-10-CM | POA: Diagnosis not present

## 2021-04-18 DIAGNOSIS — F17200 Nicotine dependence, unspecified, uncomplicated: Secondary | ICD-10-CM | POA: Insufficient documentation

## 2021-04-18 DIAGNOSIS — H02846 Edema of left eye, unspecified eyelid: Secondary | ICD-10-CM | POA: Diagnosis present

## 2021-04-18 DIAGNOSIS — T63444A Toxic effect of venom of bees, undetermined, initial encounter: Secondary | ICD-10-CM | POA: Diagnosis not present

## 2021-04-18 MED ORDER — PREDNISONE 10 MG PO TABS: ORAL_TABLET | ORAL | 0 refills | Status: DC

## 2021-04-18 MED ORDER — DEXAMETHASONE SODIUM PHOSPHATE 10 MG/ML IJ SOLN
10.0000 mg | Freq: Once | INTRAMUSCULAR | Status: AC
  Administered 2021-04-18: 10 mg via INTRAMUSCULAR
  Filled 2021-04-18: qty 1

## 2021-04-18 NOTE — Discharge Instructions (Addendum)
Follow-up with your primary care provider or [facility] acute care if any continued problems or concerns.  Return to the emergency department if any severe worsening of your symptoms. Continue with your Benadryl every 6 hours to help with itching and swelling.  The prednisone was sent to your pharmacy.  This is 3 tablets once a day for the next 4 days.  You should see a gradual improvement to the swelling around her eye.

## 2021-04-18 NOTE — ED Triage Notes (Signed)
Pt comes with c/o bee sting to left eye. Pt states this happened yesterday. Pt has swelling and redness noted to eye.

## 2021-04-18 NOTE — ED Notes (Signed)
See triage note  Presents with swelling to left eye  States he was stung by a bee yesterday  Did take benadryl yesterday  Eye is swollen and red

## 2021-04-18 NOTE — ED Provider Notes (Signed)
[facility] Emergency Department Provider Note   ____________________________________________   Event Date/Time   First MD Initiated Contact with Patient 04/18/21 [phone]     (approximate)  I have reviewed the triage vital signs and the nursing notes.   HISTORY  Chief Complaint Insect Bite   HPI Ashely Steele is a 65 y.o. male Modena Jansky to the ED with complaint of left swollen.  Patient states that he was stung by a bee yesterday and woke this morning with his swollen shut.  Patient denies any difficulty breathing or previous allergies to bee stings.  Patient states that he took Benadryl last evening.       History reviewed. No pertinent past medical history.  Patient Active Problem List   Diagnosis Date Noted  . Encounter to establish care 05/04/2020  . Tooth abscess 05/04/2020  . Rash of back 05/04/2020  . Smoking 05/04/2020    History reviewed. No pertinent surgical history.  Prior to Admission medications   Medication Sig Start Date End Date Taking? Authorizing Provider  predniSONE (DELTASONE) 10 MG tablet Take 3 tablets once a day for 4 days 04/18/21  Yes Tommi Rumps, PA-C  mupirocin cream (BACTROBAN) 2 % Apply to affected area 3 times daily 03/02/21 03/02/22  Lucy Chris, PA    Allergies Codeine and Latex  No family history on file.  Social History Social History   Tobacco Use  . Smoking status: Current Every Day Smoker  . Smokeless tobacco: Never Used  Vaping Use  . Vaping Use: Never used  Substance Use Topics  . Alcohol use: Yes    Alcohol/week: 4.0 standard drinks    Types: 4 Cans of beer per week  . Drug use: Not Currently    Review of Systems Constitutional: No fever/chills Eyes: No visual changes.  Left eyelid swelling. ENT: No sore throat. Cardiovascular: Denies chest pain. Respiratory: Denies shortness of breath. Gastrointestinal: No abdominal pain.  No nausea, no vomiting.  Musculoskeletal: Negative for  musculoskeletal pain. Skin: Negative for rash. Neurological: Negative for headaches, focal weakness or numbness.  ____________________________________________   PHYSICAL EXAM:  VITAL SIGNS: ED Triage Vitals [04/18/21 0756]  Enc Vitals Group     BP (!) 172/92     Pulse Rate 77     Resp 18     Temp 98.6 F (37 C)     Temp Source Oral     SpO2 97 %     Weight 170 lb (77.1 kg)     Height 5\' 6"  (1.676 m)     Head Circumference      Peak Flow      Pain Score 0     Pain Loc      Pain Edu?      Excl. in GC?    Constitutional: Alert and oriented. Well appearing and in no acute distress. Eyes: Conjunctivae are normal. EOMI. upper and lower eyelid is edematous.  Nontender.  Easily retracted to examine left. Head: Atraumatic. Nose: No congestion/rhinnorhea. Mouth/Throat: Mucous membranes are moist.  No edema and patient is able to talk in complete sentences without any difficulty. Neck: No stridor.   Cardiovascular: Normal rate, regular rhythm. Grossly normal heart sounds.  Good peripheral circulation. Respiratory: Normal respiratory effort.  No retractions. Lungs CTAB. Gastrointestinal: Soft and nontender. No distention. No abdominal bruits. No CVA tenderness. Musculoskeletal: No lower extremity tenderness nor edema.  No joint effusions. Neurologic:  Normal speech and language. No gross focal neurologic deficits are appreciated. No  gait instability. Skin:  Skin is warm, dry and intact. No rash noted. Psychiatric: Mood and affect are normal. Speech and behavior are normal.  ____________________________________________   LABS (all labs ordered are listed, but only abnormal results are displayed)  Labs Reviewed - No data to display ____________________________________________   PROCEDURES  Procedure(s) performed (including Critical Care):  Procedures   ____________________________________________   INITIAL IMPRESSION / ASSESSMENT AND PLAN / ED COURSE  As part of my  medical decision making, I reviewed the following data within the electronic MEDICAL RECORD NUMBER Notes from prior ED visits and [place] Controlled Substance Database  65 year old male presents to the ED after a bee sting.  Patient states he does not know exactly what stung him except for that it was yellow and black.  This morning he woke up and his eyelid is swollen shut.  He denies any visual problems when his eyelid is retracted.  He denies any difficulty breathing or any previous allergies to bee stings.  Patient was given Decadron 10 mg IM while in the ED and to continue with prednisone 30 mg daily for 4 days.  He will continue with Benadryl every 6 hours.  He is encouraged to follow-up with his PCP if any continued problems or [facility] acute care.  ____________________________________________   FINAL CLINICAL IMPRESSION(S) / ED DIAGNOSES  Final diagnoses:  Bee sting, undetermined intent, initial encounter     ED Discharge Orders         Ordered    predniSONE (DELTASONE) 10 MG tablet        04/18/21 0820          *Please note:  Richard Steele was evaluated in Emergency Department on 04/18/2021 for the symptoms described in the history of present illness. He was evaluated in the context of the global COVID-19 pandemic, which necessitated consideration that the patient might be at risk for infection with the SARS-CoV-2 virus that causes COVID-19. Institutional protocols and algorithms that pertain to the evaluation of patients at risk for COVID-19 are in a state of rapid change based on information released by regulatory bodies including the CDC and federal and state organizations. These policies and algorithms were followed during the patient's care in the ED.  Some ED evaluations and interventions may be delayed as a result of limited staffing during and the pandemic.*   Note:  This document was prepared using Dragon voice recognition software and may include unintentional dictation  errors.    Tommi Rumps, PA-C 04/18/21 0867    Minna Antis, MD 04/18/21 [phone]

## 2021-06-09 ENCOUNTER — Ambulatory Visit (INDEPENDENT_AMBULATORY_CARE_PROVIDER_SITE_OTHER): Payer: Medicare Other | Admitting: Urology

## 2021-06-09 ENCOUNTER — Encounter: Payer: Self-pay | Admitting: Urology

## 2021-06-09 ENCOUNTER — Other Ambulatory Visit: Payer: Self-pay

## 2021-06-09 VITALS — BP 172/92 | HR 61 | Ht 70.0 in | Wt 165.0 lb

## 2021-06-09 DIAGNOSIS — R972 Elevated prostate specific antigen [PSA]: Secondary | ICD-10-CM

## 2021-06-09 NOTE — Progress Notes (Signed)
   06/09/2021 12:05 PM   Richard Steele 1956-03-06 [id number]  Referring provider: Dorothey Baseman, MD [phone] [place],  [place] [address]  Chief Complaint  Patient presents with   Elevated PSA    HPI: Richard Steele is a 65 y.o. male referred for evaluation of an elevated PSA.  PSA 05/16/2021 was 11.01 Repeated 05/22/2021 and was 13.8 (percent free PSA 3.8) Urinalysis was unremarkable and urine culture was negative No prior PSA results available for comparison  PMH: History hepatitis C Dupuytren's contracture  Surgical History: Amputation left thumb  Home Medications:  Allergies as of 06/09/2021       Reactions   Penicillins Itching   Codeine Rash   Latex Rash        Medication List        Accurate as of June 09, 2021 12:05 PM. If you have any questions, ask your nurse or doctor.          mupirocin cream 2 % Commonly known as: Bactroban Apply to affected area 3 times daily   predniSONE 10 MG tablet Commonly known as: DELTASONE Take 3 tablets once a day for 4 days        Allergies:  Allergies  Allergen Reactions   Penicillins Itching   Codeine Rash   Latex Rash    Family History: No family history on file.  Social History:  reports that he has been smoking. He has never used smokeless tobacco. He reports current alcohol use of about 4.0 standard drinks of alcohol per week. He reports previous drug use.   Physical Exam: BP (!) 172/92   Pulse 61   Ht 5\' 10"  (1.778 m)   Wt 165 lb (74.8 kg)   BMI 23.68 kg/m   Constitutional:  Alert and oriented, No acute distress. HEENT: [place] AT, moist mucus membranes.  Trachea midline, no masses. Cardiovascular: No clubbing, cyanosis, or edema.  RRR Respiratory: Normal respiratory effort, clear GI: Abdomen is soft, nontender, nondistended, no abdominal masses GU: Extremely tight anal canal.  Prostate 45 g, smooth without nodules Skin: No rashes, bruises or suspicious lesions. Neurologic: Grossly  intact, no focal deficits, moving all 4 extremities. Psychiatric: Normal mood and affect.   Assessment & Plan:    1.  Elevated PSA Persistently elevated PSA above 10 and we discussed PSAs in this level are concerning for prostate cancer DRE is benign I recommended scheduling TRUS/biopsy of the prostate and he desires to schedule His anal canal is extremely tight and quite uncomfortable on DRE and do not think he would be able to tolerate TRUS and office and we will schedule under same-day surgery The procedure was discussed in detail including potential risks bleeding, infection/sepsis.  All questions were answered.   , MD  [facility] [address] [place], [place] [phone]

## 2021-06-13 ENCOUNTER — Other Ambulatory Visit: Payer: Self-pay | Admitting: *Deleted

## 2021-06-15 ENCOUNTER — Other Ambulatory Visit: Payer: Self-pay | Admitting: *Deleted

## 2021-06-15 DIAGNOSIS — R972 Elevated prostate specific antigen [PSA]: Secondary | ICD-10-CM

## 2021-06-22 ENCOUNTER — Other Ambulatory Visit: Payer: Self-pay | Admitting: Urology

## 2021-06-22 DIAGNOSIS — R972 Elevated prostate specific antigen [PSA]: Secondary | ICD-10-CM

## 2021-06-23 ENCOUNTER — Other Ambulatory Visit: Payer: Self-pay | Admitting: Urology

## 2021-06-23 DIAGNOSIS — R972 Elevated prostate specific antigen [PSA]: Secondary | ICD-10-CM

## 2021-07-05 ENCOUNTER — Other Ambulatory Visit: Payer: Self-pay | Admitting: *Deleted

## 2021-07-05 DIAGNOSIS — R972 Elevated prostate specific antigen [PSA]: Secondary | ICD-10-CM

## 2021-07-06 ENCOUNTER — Other Ambulatory Visit: Payer: Self-pay

## 2021-07-06 ENCOUNTER — Other Ambulatory Visit: Payer: Medicare Other

## 2021-07-06 DIAGNOSIS — R972 Elevated prostate specific antigen [PSA]: Secondary | ICD-10-CM

## 2021-07-07 ENCOUNTER — Other Ambulatory Visit: Payer: Self-pay | Admitting: Gastroenterology

## 2021-07-07 DIAGNOSIS — F1010 Alcohol abuse, uncomplicated: Secondary | ICD-10-CM

## 2021-07-07 DIAGNOSIS — B182 Chronic viral hepatitis C: Secondary | ICD-10-CM

## 2021-07-10 LAB — CULTURE, URINE COMPREHENSIVE

## 2021-07-11 ENCOUNTER — Other Ambulatory Visit
Admission: RE | Admit: 2021-07-11 | Discharge: 2021-07-11 | Disposition: A | Discharge status: Home / Self Care | Payer: Medicare Other | Source: Ambulatory Visit | Attending: Urology | Admitting: Urology

## 2021-07-11 ENCOUNTER — Other Ambulatory Visit: Payer: Self-pay

## 2021-07-11 HISTORY — DX: Pneumonia, unspecified organism: J18.9

## 2021-07-11 HISTORY — DX: Inflammatory liver disease, unspecified: K75.9

## 2021-07-11 HISTORY — DX: Palmar fascial fibromatosis (dupuytren): M72.0

## 2021-07-11 NOTE — Patient Instructions (Addendum)
Your procedure is scheduled on: Tuesday, August 2 Report to the Registration Desk on the 1st floor of the [facility] To find out your arrival time, please call [phone] between 1PM - 3PM on: Monday, August 1  REMEMBER: Instructions that are not followed completely may result in serious medical risk, up to and including death; or upon the discretion of your surgeon and anesthesiologist your surgery may need to be rescheduled.  Do not eat or drink after midnight the night before surgery.  No gum chewing, lozengers or hard candies.  DO NOT TAKE ANY MEDICATIONS THE MORNING OF SURGERY   One week prior to surgery: STARTING July 26 Stop Anti-inflammatories (NSAIDS) such as Advil, Aleve, Ibuprofen, Motrin, Naproxen, Naprosyn and Aspirin based products such as Excedrin, Goodys Powder, BC Powder. Stop ANY OVER THE COUNTER supplements until after surgery. You may however, continue to take Tylenol if needed for pain up until the day of surgery.  No Alcohol for 24 hours before or after surgery.  No Smoking including e-cigarettes for 24 hours prior to surgery.  No chewable tobacco products for at least 6 hours prior to surgery.  No nicotine patches on the day of surgery.  Do not use any "recreational" drugs for at least a week prior to your surgery.  Please be advised that the combination of cocaine and anesthesia may have negative outcomes, up to and including death. If you test positive for cocaine, your surgery will be cancelled.  On the morning of surgery brush your teeth with toothpaste and water, you may rinse your mouth with mouthwash if you wish. Do not swallow any toothpaste or mouthwash.  Do not wear jewelry, make-up, hairpins, clips or nail polish.  Do not wear lotions, powders, or perfumes.   Contact lenses, hearing aids and dentures may not be worn into surgery.  Do not bring valuables to the hospital. [facility] is not responsible for any missing/lost belongings or  valuables.   Notify your doctor if there is any change in your medical condition (cold, fever, infection).  Wear comfortable clothing (specific to your surgery type) to the hospital.  After surgery, you can help prevent lung complications by doing breathing exercises.  Take deep breaths and cough every 1-2 hours. Your doctor may order a device called an Incentive Spirometer to help you take deep breaths.  If you are being discharged the day of surgery, you will not be allowed to drive home. You will need a responsible adult (18 years or older) to drive you home and stay with you that night.   If you are taking public transportation, you will need to have a responsible adult (18 years or older) with you. Please confirm with your physician that it is acceptable to use public transportation.   Please call the Pre-admissions Testing Dept. at [phone] if you have any questions about these instructions.  Surgery Visitation Policy:  Patients undergoing a surgery or procedure may have one family member or support person with them as long as that person is not COVID-19 positive or experiencing its symptoms.  That person may remain in the waiting area during the procedure.

## 2021-07-11 NOTE — Pre-Procedure Instructions (Signed)
During preop interview, patient was expressing frustration with medical processes and procedures. Stated the he lived in a boarding home in [place] and didn't want to tell or know the name of it. Stated that he was going to set up transportation with the insurance company to get him to and from the hospital on the day of surgery. Informed him that he would need medical transportation and his surgery time is scheduled for first case that day and would probably need to be here at 6 am and should be done by 10 am. Also stated that if he couldn't find the [facility]" then he's not going to show up. Informed Dr. Lonna Cobb office of his transportation situation and after care at home.

## 2021-07-12 ENCOUNTER — Telehealth: Payer: Self-pay | Admitting: Urology

## 2021-07-12 NOTE — Telephone Encounter (Signed)
Pt. Called and states he is cancelling surgery due to his living situation at this time he has no one to assist him or stay with him after surgery. Pt. States he has already cancelled with Radiology.

## 2021-07-14 ENCOUNTER — Ambulatory Visit: Payer: Medicare Other

## 2021-07-14 ENCOUNTER — Encounter: Payer: Self-pay | Admitting: Urology

## 2021-07-14 NOTE — Telephone Encounter (Signed)
Case cancelled with OR

## 2021-07-14 NOTE — Telephone Encounter (Signed)
Letter sent.

## 2021-07-18 ENCOUNTER — Encounter: Admission: RE | Payer: Self-pay | Source: Home / Self Care

## 2021-07-18 ENCOUNTER — Ambulatory Visit: Admission: RE | Admit: 2021-07-18 | Payer: Medicare Other | Source: Ambulatory Visit

## 2021-07-18 ENCOUNTER — Ambulatory Visit: Admission: RE | Admit: 2021-07-18 | Payer: Medicare Other | Source: Home / Self Care | Admitting: Urology

## 2021-07-18 SURGERY — ULTRASOUND, RECTAL APPROACH
Anesthesia: Monitor Anesthesia Care

## 2021-07-20 ENCOUNTER — Institutional Professional Consult (permissible substitution): Payer: Medicare Other | Admitting: Plastic Surgery

## 2021-09-29 ENCOUNTER — Encounter: Payer: Self-pay | Admitting: Internal Medicine

## 2021-10-02 ENCOUNTER — Encounter: Admission: RE | Payer: Self-pay | Source: Ambulatory Visit

## 2021-10-02 ENCOUNTER — Encounter: Payer: Self-pay | Admitting: Anesthesiology

## 2021-10-02 ENCOUNTER — Ambulatory Visit: Admission: RE | Admit: 2021-10-02 | Payer: Medicare Other | Source: Ambulatory Visit | Admitting: Internal Medicine

## 2021-10-02 SURGERY — COLONOSCOPY WITH PROPOFOL
Anesthesia: General

## 2024-04-17 ENCOUNTER — Emergency Department

## 2024-04-17 ENCOUNTER — Emergency Department
Admission: EM | Admit: 2024-04-17 | Discharge: 2024-04-17 | Disposition: A | Discharge status: Home / Self Care | Attending: Emergency Medicine | Admitting: Emergency Medicine

## 2024-04-17 ENCOUNTER — Other Ambulatory Visit: Payer: Self-pay

## 2024-04-17 DIAGNOSIS — R0781 Pleurodynia: Secondary | ICD-10-CM | POA: Diagnosis present

## 2024-04-17 DIAGNOSIS — Y92410 Unspecified street and highway as the place of occurrence of the external cause: Secondary | ICD-10-CM | POA: Diagnosis not present

## 2024-04-17 DIAGNOSIS — Y9355 Activity, bike riding: Secondary | ICD-10-CM | POA: Diagnosis not present

## 2024-04-17 MED ORDER — KETOROLAC TROMETHAMINE 15 MG/ML IJ SOLN
15.0000 mg | Freq: Once | INTRAMUSCULAR | Status: AC
  Administered 2024-04-17: 15 mg via INTRAVENOUS
  Filled 2024-04-17: qty 1

## 2024-04-17 MED ORDER — LIDOCAINE 5 % EX PTCH
1.0000 | MEDICATED_PATCH | CUTANEOUS | Status: DC
  Administered 2024-04-17: 1 via TRANSDERMAL
  Filled 2024-04-17: qty 1

## 2024-04-17 MED ORDER — MELOXICAM 15 MG PO TABS: 15.0000 mg | ORAL_TABLET | Freq: Every day | ORAL | 0 refills | Status: AC
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
## 2024-04-17 NOTE — Discharge Instructions (Signed)
 You were evaluated in the ED for left side rib cage pain.  Your x-ray is reassuring of any acute life-threatening injuries or illnesses.  Your physical exam findings are reassuring.  Please get plenty of rest.  Follow-up with your primary care provider.  Return to the ED if worsening symptoms or shortness of breath, chest pain.  A list of primary care providers accepting new patients has been listed for you below.  Please go to the following website to schedule new (and existing) patient appointments:   [email or web address]   The following is a list of primary care offices in the area who are accepting new patients at this time.  Please reach out to one of them directly and let them know you would like to schedule an appointment to follow up on an Emergency Department visit, and/or to establish a new primary care provider (PCP).  There are likely other primary care clinics in the are who are accepting new patients, but this is an excellent place to start:  [facility] Lead physician: Dr Aden Agreste [address] [place], [place] [address] [phone]  [facility] Lead Physician: Dr Arleen Lacer [address], [place], [place] [address] [phone]  [facility]  Lead Physician: Dr Terre Ferri [address] [place], [place] [address] [phone]  [facility] Lead Physician: Dr Audrie Blind [address] [place], [place] [address] [phone]  [facility] Lead Physician: Dr Janna Melter [address] [place], [place], [place] [address] [phone]
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
## 2024-04-18 ENCOUNTER — Emergency Department

## 2024-04-18 ENCOUNTER — Other Ambulatory Visit: Payer: Self-pay

## 2024-04-18 ENCOUNTER — Observation Stay
Admission: EM | Admit: 2024-04-18 | Discharge: 2024-04-19 | Disposition: A | Discharge status: Home / Self Care | Attending: Osteopathic Medicine | Admitting: Osteopathic Medicine

## 2024-04-18 DIAGNOSIS — Z1152 Encounter for screening for COVID-19: Secondary | ICD-10-CM | POA: Insufficient documentation

## 2024-04-18 DIAGNOSIS — S2242XD Multiple fractures of ribs, left side, subsequent encounter for fracture with routine healing: Secondary | ICD-10-CM

## 2024-04-18 DIAGNOSIS — J209 Acute bronchitis, unspecified: Secondary | ICD-10-CM | POA: Diagnosis not present

## 2024-04-18 DIAGNOSIS — Z79899 Other long term (current) drug therapy: Secondary | ICD-10-CM | POA: Diagnosis not present

## 2024-04-18 DIAGNOSIS — E663 Overweight: Secondary | ICD-10-CM | POA: Insufficient documentation

## 2024-04-18 DIAGNOSIS — S2242XA Multiple fractures of ribs, left side, initial encounter for closed fracture: Secondary | ICD-10-CM | POA: Diagnosis not present

## 2024-04-18 DIAGNOSIS — R079 Chest pain, unspecified: Secondary | ICD-10-CM | POA: Diagnosis present

## 2024-04-18 DIAGNOSIS — Z720 Tobacco use: Secondary | ICD-10-CM | POA: Diagnosis present

## 2024-04-18 DIAGNOSIS — F1290 Cannabis use, unspecified, uncomplicated: Secondary | ICD-10-CM | POA: Insufficient documentation

## 2024-04-18 DIAGNOSIS — I160 Hypertensive urgency: Secondary | ICD-10-CM | POA: Diagnosis not present

## 2024-04-18 DIAGNOSIS — Z91040 Latex allergy status: Secondary | ICD-10-CM | POA: Insufficient documentation

## 2024-04-18 DIAGNOSIS — F1090 Alcohol use, unspecified, uncomplicated: Secondary | ICD-10-CM | POA: Diagnosis not present

## 2024-04-18 DIAGNOSIS — F17210 Nicotine dependence, cigarettes, uncomplicated: Secondary | ICD-10-CM | POA: Diagnosis not present

## 2024-04-18 DIAGNOSIS — S2232XA Fracture of one rib, left side, initial encounter for closed fracture: Secondary | ICD-10-CM | POA: Diagnosis present

## 2024-04-18 DIAGNOSIS — Z6826 Body mass index (BMI) 26.0-26.9, adult: Secondary | ICD-10-CM | POA: Diagnosis not present

## 2024-04-18 DIAGNOSIS — J189 Pneumonia, unspecified organism: Secondary | ICD-10-CM

## 2024-04-18 HISTORY — DX: Tobacco use: Z72.0

## 2024-04-18 LAB — CBC WITH DIFFERENTIAL/PLATELET
Abs Immature Granulocytes: 0.06 10*3/uL (ref 0.00–0.07)
Basophils Absolute: 0.1 10*3/uL (ref 0.0–0.1)
Basophils Relative: 1 %
Eosinophils Absolute: 0.1 10*3/uL (ref 0.0–0.5)
Eosinophils Relative: 0 %
HCT: 54 % — ABNORMAL HIGH (ref 39.0–52.0)
Hemoglobin: 18.2 g/dL — ABNORMAL HIGH (ref 13.0–17.0)
Immature Granulocytes: 1 %
Lymphocytes Relative: 18 %
Lymphs Abs: 2.2 10*3/uL (ref 0.7–4.0)
MCH: 31.3 pg (ref 26.0–34.0)
MCHC: 33.7 g/dL (ref 30.0–36.0)
MCV: 92.9 fL (ref 80.0–100.0)
Monocytes Absolute: 1.6 10*3/uL — ABNORMAL HIGH (ref 0.1–1.0)
Monocytes Relative: 13 %
Neutro Abs: 8.6 10*3/uL — ABNORMAL HIGH (ref 1.7–7.7)
Neutrophils Relative %: 67 %
Platelets: 280 10*3/uL (ref 150–400)
RBC: 5.81 MIL/uL (ref 4.22–5.81)
RDW: 13.2 % (ref 11.5–15.5)
WBC: 12.6 10*3/uL — ABNORMAL HIGH (ref 4.0–10.5)
nRBC: 0 % (ref 0.0–0.2)

## 2024-04-18 LAB — BASIC METABOLIC PANEL WITH GFR
Anion gap: 10 (ref 5–15)
BUN: 10 mg/dL (ref 8–23)
CO2: 23 mmol/L (ref 22–32)
Calcium: 9.5 mg/dL (ref 8.9–10.3)
Chloride: 102 mmol/L (ref 98–111)
Creatinine, Ser: 1.04 mg/dL (ref 0.61–1.24)
GFR, Estimated: >60 mL/min (ref >60)
Glucose, Bld: 115 mg/dL — ABNORMAL HIGH (ref 70–99)
Potassium: 3.8 mmol/L (ref 3.5–5.1)
Sodium: 135 mmol/L (ref 135–145)

## 2024-04-18 LAB — TROPONIN I (HIGH SENSITIVITY): Troponin I (High Sensitivity): 7 ng/L (ref <18)

## 2024-04-18 MED ORDER — IPRATROPIUM-ALBUTEROL 0.5-2.5 (3) MG/3ML IN SOLN: 3.0000 mL | RESPIRATORY_TRACT | Status: DC

## 2024-04-18 MED ORDER — NICOTINE 21 MG/24HR TD PT24
21.0000 mg | MEDICATED_PATCH | Freq: Every day | TRANSDERMAL | Status: DC
  Filled 2024-04-18: qty 1

## 2024-04-18 MED ORDER — DM-GUAIFENESIN ER 30-600 MG PO TB12
1.0000 | ORAL_TABLET | Freq: Two times a day (BID) | ORAL | Status: DC | PRN
Start: 2024-04-18 — End: 2024-04-19

## 2024-04-18 MED ORDER — ONDANSETRON HCL 4 MG/2ML IJ SOLN: 4.0000 mg | Freq: Three times a day (TID) | INTRAMUSCULAR | Status: DC | PRN

## 2024-04-18 MED ORDER — IPRATROPIUM-ALBUTEROL 0.5-2.5 (3) MG/3ML IN SOLN
3.0000 mL | RESPIRATORY_TRACT | Status: DC
  Filled 2024-04-18 (×2): qty 3

## 2024-04-18 MED ORDER — LIDOCAINE 5 % EX PTCH: 1.0000 | MEDICATED_PATCH | CUTANEOUS | Status: DC

## 2024-04-18 MED ORDER — ACETYLCYSTEINE 20 % IN SOLN
4.0000 mL | Freq: Once | RESPIRATORY_TRACT | Status: AC
  Administered 2024-04-18: 4 mL via RESPIRATORY_TRACT
  Filled 2024-04-18: qty 4

## 2024-04-18 MED ORDER — LIDOCAINE 5 % EX PTCH
1.0000 | MEDICATED_PATCH | Freq: Every day | CUTANEOUS | Status: DC
  Administered 2024-04-19: 1 via TRANSDERMAL
  Filled 2024-04-18: qty 1

## 2024-04-18 MED ORDER — IPRATROPIUM-ALBUTEROL 0.5-2.5 (3) MG/3ML IN SOLN
3.0000 mL | Freq: Once | RESPIRATORY_TRACT | Status: AC
  Administered 2024-04-18: 3 mL via RESPIRATORY_TRACT
  Filled 2024-04-18: qty 3

## 2024-04-18 MED ORDER — FENTANYL CITRATE PF 50 MCG/ML IJ SOSY
50.0000 ug | PREFILLED_SYRINGE | Freq: Once | INTRAMUSCULAR | Status: AC
  Administered 2024-04-18: 50 ug via INTRAVENOUS
  Filled 2024-04-18: qty 1

## 2024-04-18 MED ORDER — ALBUTEROL SULFATE HFA 108 (90 BASE) MCG/ACT IN AERS: 2.0000 | INHALATION_SPRAY | RESPIRATORY_TRACT | Status: DC | PRN

## 2024-04-18 MED ORDER — FENTANYL CITRATE PF 50 MCG/ML IJ SOSY
25.0000 ug | PREFILLED_SYRINGE | INTRAMUSCULAR | Status: DC | PRN
  Administered 2024-04-19: 25 ug via INTRAVENOUS
  Filled 2024-04-18: qty 1

## 2024-04-18 MED ORDER — SODIUM CHLORIDE 0.9 % IV SOLN
1.0000 g | Freq: Once | INTRAVENOUS | Status: AC
  Administered 2024-04-18: 1 g via INTRAVENOUS
  Filled 2024-04-18: qty 10

## 2024-04-18 MED ORDER — ALBUTEROL SULFATE (2.5 MG/3ML) 0.083% IN NEBU: 2.5000 mg | INHALATION_SOLUTION | RESPIRATORY_TRACT | Status: DC | PRN

## 2024-04-18 MED ORDER — ACETAMINOPHEN 325 MG PO TABS: 650.0000 mg | ORAL_TABLET | Freq: Four times a day (QID) | ORAL | Status: DC | PRN

## 2024-04-18 MED ORDER — ENOXAPARIN SODIUM 40 MG/0.4ML IJ SOSY
40.0000 mg | PREFILLED_SYRINGE | INTRAMUSCULAR | Status: DC
  Administered 2024-04-19: 40 mg via SUBCUTANEOUS
  Filled 2024-04-18: qty 0.4

## 2024-04-18 MED ORDER — SODIUM CHLORIDE 0.9 % IV SOLN
500.0000 mg | Freq: Once | INTRAVENOUS | Status: AC
  Administered 2024-04-19: 500 mg via INTRAVENOUS
  Filled 2024-04-18: qty 5

## 2024-04-18 MED ORDER — OXYCODONE-ACETAMINOPHEN 5-325 MG PO TABS: 1.0000 | ORAL_TABLET | ORAL | Status: DC | PRN

## 2024-04-18 MED ORDER — HYDRALAZINE HCL 20 MG/ML IJ SOLN
5.0000 mg | INTRAMUSCULAR | Status: DC | PRN
  Administered 2024-04-19: 5 mg via INTRAVENOUS
  Filled 2024-04-18: qty 1

## 2024-04-18 MED ORDER — ACETYLCYSTEINE 20 % IN SOLN
4.0000 mL | Freq: Two times a day (BID) | RESPIRATORY_TRACT | Status: DC
  Administered 2024-04-19: 4 mL via RESPIRATORY_TRACT
  Filled 2024-04-18: qty 4
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
## 2024-04-18 NOTE — ED Notes (Signed)
 Pt to CT

## 2024-04-18 NOTE — ED Notes (Signed)
 CCMD called for cardiac monitoring.

## 2024-04-18 NOTE — ED Notes (Signed)
 Blue, red, light green, and purple blood tube samples sent to lab.
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
## 2024-04-19 ENCOUNTER — Encounter: Payer: Self-pay | Admitting: Internal Medicine

## 2024-04-19 DIAGNOSIS — J209 Acute bronchitis, unspecified: Secondary | ICD-10-CM | POA: Diagnosis not present

## 2024-04-19 DIAGNOSIS — S2242XD Multiple fractures of ribs, left side, subsequent encounter for fracture with routine healing: Secondary | ICD-10-CM | POA: Diagnosis not present

## 2024-04-19 LAB — BASIC METABOLIC PANEL WITH GFR
Anion gap: 13 (ref 5–15)
BUN: 10 mg/dL (ref 8–23)
CO2: 22 mmol/L (ref 22–32)
Calcium: 9.4 mg/dL (ref 8.9–10.3)
Chloride: 102 mmol/L (ref 98–111)
Creatinine, Ser: 0.99 mg/dL (ref 0.61–1.24)
GFR, Estimated: >60 mL/min (ref >60)
Glucose, Bld: 133 mg/dL — ABNORMAL HIGH (ref 70–99)
Potassium: 3.4 mmol/L — ABNORMAL LOW (ref 3.5–5.1)
Sodium: 137 mmol/L (ref 135–145)

## 2024-04-19 LAB — HIV ANTIBODY (ROUTINE TESTING W REFLEX): HIV Screen 4th Generation wRfx: NONREACTIVE

## 2024-04-19 LAB — CBC
HCT: 51.6 % (ref 39.0–52.0)
Hemoglobin: 17.4 g/dL — ABNORMAL HIGH (ref 13.0–17.0)
MCH: 31.3 pg (ref 26.0–34.0)
MCHC: 33.7 g/dL (ref 30.0–36.0)
MCV: 92.8 fL (ref 80.0–100.0)
Platelets: 250 10*3/uL (ref 150–400)
RBC: 5.56 MIL/uL (ref 4.22–5.81)
RDW: 13.1 % (ref 11.5–15.5)
WBC: 12.5 10*3/uL — ABNORMAL HIGH (ref 4.0–10.5)
nRBC: 0 % (ref 0.0–0.2)

## 2024-04-19 LAB — HEPATIC FUNCTION PANEL
ALT: 15 U/L (ref 0–44)
AST: 19 U/L (ref 15–41)
Albumin: 4 g/dL (ref 3.5–5.0)
Alkaline Phosphatase: 68 U/L (ref 38–126)
Bilirubin, Direct: 0.2 mg/dL (ref 0.0–0.2)
Indirect Bilirubin: 0.9 mg/dL (ref 0.3–0.9)
Total Bilirubin: 1.1 mg/dL (ref 0.0–1.2)
Total Protein: 7.6 g/dL (ref 6.5–8.1)

## 2024-04-19 LAB — RESP PANEL BY RT-PCR (RSV, FLU A&B, COVID)  RVPGX2
Influenza A by PCR: NEGATIVE
Influenza B by PCR: NEGATIVE
Resp Syncytial Virus by PCR: NEGATIVE
SARS Coronavirus 2 by RT PCR: NEGATIVE

## 2024-04-19 LAB — MAGNESIUM: Magnesium: 2.2 mg/dL (ref 1.7–2.4)

## 2024-04-19 MED ORDER — LORAZEPAM 2 MG/ML IJ SOLN
1.0000 mg | INTRAMUSCULAR | Status: DC | PRN
  Administered 2024-04-19: 3 mg via INTRAVENOUS
  Filled 2024-04-19: qty 2

## 2024-04-19 MED ORDER — SODIUM CHLORIDE 0.9 % IV SOLN: 1.0000 g | INTRAVENOUS | Status: DC

## 2024-04-19 MED ORDER — AMLODIPINE BESYLATE 5 MG PO TABS
5.0000 mg | ORAL_TABLET | Freq: Every day | ORAL | Status: DC
  Administered 2024-04-19: 5 mg via ORAL
  Filled 2024-04-19: qty 1

## 2024-04-19 MED ORDER — FENTANYL CITRATE PF 50 MCG/ML IJ SOSY: 25.0000 ug | PREFILLED_SYRINGE | INTRAMUSCULAR | Status: DC | PRN

## 2024-04-19 MED ORDER — METHOCARBAMOL 500 MG PO TABS
1000.0000 mg | ORAL_TABLET | Freq: Four times a day (QID) | ORAL | Status: DC
  Administered 2024-04-19: 1000 mg via ORAL
  Filled 2024-04-19: qty 2

## 2024-04-19 MED ORDER — AZITHROMYCIN 250 MG PO TABS: 250.0000 mg | ORAL_TABLET | Freq: Every day | ORAL | 0 refills | Status: AC

## 2024-04-19 MED ORDER — AMLODIPINE BESYLATE 5 MG PO TABS: 5.0000 mg | ORAL_TABLET | Freq: Every day | ORAL | 0 refills | Status: AC

## 2024-04-19 MED ORDER — ALPRAZOLAM 0.25 MG PO TABS
0.2500 mg | ORAL_TABLET | Freq: Once | ORAL | Status: AC
  Administered 2024-04-19: 0.25 mg via ORAL
  Filled 2024-04-19: qty 1

## 2024-04-19 MED ORDER — METHOCARBAMOL 1000 MG PO TABS
500.0000 mg | ORAL_TABLET | Freq: Four times a day (QID) | ORAL | 0 refills | Status: AC
Start: 2024-04-19 — End: 2024-04-24

## 2024-04-19 MED ORDER — FENTANYL CITRATE PF 50 MCG/ML IJ SOSY: 50.0000 ug | PREFILLED_SYRINGE | INTRAMUSCULAR | Status: DC | PRN

## 2024-04-19 MED ORDER — OXYCODONE HCL 5 MG PO TABS: 10.0000 mg | ORAL_TABLET | ORAL | Status: DC | PRN

## 2024-04-19 MED ORDER — ACETAMINOPHEN 325 MG PO TABS: 650.0000 mg | ORAL_TABLET | Freq: Four times a day (QID) | ORAL | 0 refills | Status: AC | PRN

## 2024-04-19 MED ORDER — SODIUM CHLORIDE 0.9 % IV SOLN: 500.0000 mg | INTRAVENOUS | Status: DC

## 2024-04-19 MED ORDER — AMOXICILLIN-POT CLAVULANATE 875-125 MG PO TABS: 1.0000 | ORAL_TABLET | Freq: Two times a day (BID) | ORAL | 0 refills | Status: AC

## 2024-04-19 MED ORDER — FENTANYL CITRATE PF 50 MCG/ML IJ SOSY
50.0000 ug | PREFILLED_SYRINGE | Freq: Once | INTRAMUSCULAR | Status: AC
  Administered 2024-04-19: 50 ug via INTRAVENOUS
  Filled 2024-04-19: qty 1

## 2024-04-19 MED ORDER — IBUPROFEN 600 MG PO TABS
600.0000 mg | ORAL_TABLET | Freq: Four times a day (QID) | ORAL | Status: DC
  Administered 2024-04-19: 600 mg via ORAL
  Filled 2024-04-19: qty 1

## 2024-04-19 MED ORDER — THIAMINE MONONITRATE 100 MG PO TABS
100.0000 mg | ORAL_TABLET | Freq: Every day | ORAL | Status: DC
  Administered 2024-04-19: 100 mg via ORAL
  Filled 2024-04-19: qty 1

## 2024-04-19 MED ORDER — FOLIC ACID 1 MG PO TABS
1.0000 mg | ORAL_TABLET | Freq: Every day | ORAL | Status: DC
  Administered 2024-04-19: 1 mg via ORAL
  Filled 2024-04-19: qty 1

## 2024-04-19 MED ORDER — LORAZEPAM 1 MG PO TABS: 1.0000 mg | ORAL_TABLET | ORAL | Status: DC | PRN

## 2024-04-19 MED ORDER — OXYCODONE HCL 10 MG PO TABS
5.0000 mg | ORAL_TABLET | Freq: Four times a day (QID) | ORAL | 0 refills | Status: AC | PRN
Start: 2024-04-19 — End: ?

## 2024-04-19 MED ORDER — KETOROLAC TROMETHAMINE 30 MG/ML IJ SOLN
30.0000 mg | Freq: Four times a day (QID) | INTRAMUSCULAR | Status: DC
  Administered 2024-04-19: 30 mg via INTRAVENOUS
  Filled 2024-04-19: qty 1

## 2024-04-19 MED ORDER — ADULT MULTIVITAMIN W/MINERALS CH
1.0000 | ORAL_TABLET | Freq: Every day | ORAL | Status: DC
  Administered 2024-04-19: 1 via ORAL
  Filled 2024-04-19: qty 1

## 2024-04-19 MED ORDER — IBUPROFEN 600 MG PO TABS: 600.0000 mg | ORAL_TABLET | Freq: Four times a day (QID) | ORAL | 0 refills | Status: AC

## 2024-04-19 MED ORDER — THIAMINE HCL 100 MG/ML IJ SOLN: 100.0000 mg | Freq: Every day | INTRAMUSCULAR | Status: DC
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
## 2024-04-19 NOTE — ED Notes (Signed)
 Pt stated he can not stand to lay on the stretcher anymore. Hospital bed brought into room for pt. Pt up to bathroom to try and provide urine sample.
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
## 2024-04-19 NOTE — ED Notes (Signed)
 Pt placed on 2L [place] to help ease breathing.

## 2024-04-19 NOTE — ED Notes (Signed)
 Pt took off O2 monitor.

## 2024-04-19 NOTE — ED Notes (Signed)
 NP Morris paged about pt's condition.

## 2024-04-19 NOTE — ED Notes (Signed)
 Pt given specimen cup to provide sputum sample when able.

## 2024-04-19 NOTE — ED Notes (Signed)
 Pt currently refusing breathing treatment

## 2024-04-19 NOTE — ED Notes (Signed)
 Provider notified about pt's ciwa. Provider at bedside.

## 2024-04-19 NOTE — ED Notes (Signed)
 Pt unable to void at this time.

## 2024-04-19 NOTE — ED Notes (Signed)
 PT took O2 off.

## 2024-04-19 NOTE — ED Notes (Signed)
 Blood cultures were drawn before antibiotic were  started even though blood culture labels were printed after antibiotics were started. The label printer in pt's room was not working so this RN had to print labels elsewhere.

## 2024-04-19 NOTE — ED Notes (Signed)
 Pt cussing about vital machine wires and vital machine alarming. Pt told not to cuss at this RN about it. Pt educated that the machine is alarming because his vitals are out of normal range. Pt told this RN is doing everything possible to make pt comfortable.

## 2024-04-19 NOTE — ED Notes (Signed)
 Pt is sitting in chair. Refuses to get into his hospital bed. He is extremely anxious. MD messaged about pt's condition.

## 2024-04-24 LAB — CULTURE, BLOOD (ROUTINE X 2)
Culture: NO GROWTH
Culture: NO GROWTH
Special Requests: ADEQUATE
Special Requests: ADEQUATE
